# Patient Record
Sex: Male | Born: 1995 | Race: Black or African American | Hispanic: No | Marital: Single | Smoking: Current every day smoker
Health system: Southern US, Community
[De-identification: ages and names within clinical notes are randomized; demographics above are authoritative.]

---
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
## 2016-09-09 ENCOUNTER — Emergency Department (HOSPITAL_BASED_OUTPATIENT_CLINIC_OR_DEPARTMENT_OTHER)
Admission: EM | Admit: 2016-09-09 | Discharge: 2016-09-09 | Disposition: A | Discharge status: Home / Self Care | Payer: Medicaid Other | Attending: Emergency Medicine | Admitting: Emergency Medicine

## 2016-09-09 ENCOUNTER — Encounter (HOSPITAL_BASED_OUTPATIENT_CLINIC_OR_DEPARTMENT_OTHER): Payer: Self-pay | Admitting: Emergency Medicine

## 2016-09-09 DIAGNOSIS — R102 Pelvic and perineal pain: Secondary | ICD-10-CM | POA: Insufficient documentation

## 2016-09-09 DIAGNOSIS — Z5321 Procedure and treatment not carried out due to patient leaving prior to being seen by health care provider: Secondary | ICD-10-CM | POA: Insufficient documentation

## 2016-09-09 LAB — URINALYSIS, ROUTINE W REFLEX MICROSCOPIC
Bilirubin Urine: NEGATIVE
Glucose, UA: NEGATIVE mg/dL
Hgb urine dipstick: NEGATIVE
KETONES UR: NEGATIVE mg/dL
NITRITE: NEGATIVE
PROTEIN: NEGATIVE mg/dL
Specific Gravity, Urine: 1.023 (ref 1.005–1.030)
pH: 7.5 (ref 5.0–8.0)

## 2016-09-09 LAB — URINALYSIS, MICROSCOPIC (REFLEX)

## 2016-09-09 NOTE — ED Triage Notes (Signed)
Patient reports that he is having some lower pelvic pain intermitently and a "strange feeling" when he urinats thinks he has an STD

## 2016-09-09 NOTE — ED Notes (Signed)
ptient states that he does not want to stay any longer. The patient reports that he will come back later and see if it will be a shorter wait. Discussed the risks of leaving

## 2016-09-10 ENCOUNTER — Encounter (HOSPITAL_BASED_OUTPATIENT_CLINIC_OR_DEPARTMENT_OTHER): Payer: Self-pay | Admitting: Emergency Medicine

## 2016-09-10 ENCOUNTER — Emergency Department (HOSPITAL_BASED_OUTPATIENT_CLINIC_OR_DEPARTMENT_OTHER)
Admission: EM | Admit: 2016-09-10 | Discharge: 2016-09-10 | Disposition: A | Discharge status: Home / Self Care | Payer: Self-pay | Attending: Emergency Medicine | Admitting: Emergency Medicine

## 2016-09-10 DIAGNOSIS — Z202 Contact with and (suspected) exposure to infections with a predominantly sexual mode of transmission: Secondary | ICD-10-CM | POA: Insufficient documentation

## 2016-09-10 DIAGNOSIS — F17200 Nicotine dependence, unspecified, uncomplicated: Secondary | ICD-10-CM | POA: Insufficient documentation

## 2016-09-10 DIAGNOSIS — R369 Urethral discharge, unspecified: Secondary | ICD-10-CM

## 2016-09-10 DIAGNOSIS — N342 Other urethritis: Secondary | ICD-10-CM

## 2016-09-10 DIAGNOSIS — N341 Nonspecific urethritis: Secondary | ICD-10-CM | POA: Insufficient documentation

## 2016-09-10 MED ORDER — CEFTRIAXONE SODIUM 250 MG IJ SOLR
250.0000 mg | Freq: Once | INTRAMUSCULAR | Status: AC
  Administered 2016-09-10: 250 mg via INTRAMUSCULAR
  Filled 2016-09-10: qty 250

## 2016-09-10 MED ORDER — AZITHROMYCIN 250 MG PO TABS
1000.0000 mg | ORAL_TABLET | Freq: Once | ORAL | Status: AC
  Administered 2016-09-10: 1000 mg via ORAL
  Filled 2016-09-10: qty 4

## 2016-09-10 NOTE — Discharge Instructions (Signed)
You have been treated for a STD today - the results will be available on MyChart in 2-3 days. Do not have sex for two weeks. Have all partners tested and treated Use condoms to prevent STDs Return for worsening symptoms

## 2016-09-10 NOTE — ED Provider Notes (Signed)
[facility] Provider [facility]   CSN: [id number] Arrival date & time: 09/10/16  1929     History   Chief Complaint Chief Complaint  Patient presents with  . Exposure to STD    HPI Lawrence Ortega is a 21 y.o. male who presents with dysuria. He states that he's had this feeling for about a week when he urinates. He has a burning discomfort when he urinates. He denies penile discharge. He had unprotected sex with a new partner on vacation last week. No fever or genital sores. He has never had an STD before.  HPI  History reviewed. No pertinent past medical history.  There are no active problems to display for this patient.   History reviewed. No pertinent surgical history.     Home Medications    Prior to Admission medications   Medication Sig Start Date End Date Taking? Authorizing Provider  ibuprofen (ADVIL,MOTRIN) 200 MG tablet Take 400 mg by mouth once.      [provider]  Lidocaine, Anorectal, 5 % CREA Apply to rectum before or after bowel movements as needed for pain. 12/27/11   Molpus, Jonny RuizJohn, MD    Family History History reviewed. No pertinent family history.  Social History Social History  Substance Use Topics  . Smoking status: Current Every Day Smoker  . Smokeless tobacco: Never Used  . Alcohol use No     Allergies   Patient has no known allergies.   Review of Systems Review of Systems  Constitutional: Negative for fever.  Genitourinary: Positive for dysuria. Negative for difficulty urinating, discharge, genital sores, penile pain and testicular pain.     Physical Exam Updated Vital Signs BP 119/69 (BP Location: Right Arm)   Pulse 72   Temp 98.5 F (36.9 C) (Oral)   Resp 18   Ht 5\' 6"  (1.676 m)   Wt 88.5 kg (195 lb)   SpO2 100%   BMI 31.47 kg/m   Physical Exam  Constitutional: He is oriented to person, place, and time. He appears well-developed and well-nourished. No distress.  HENT:  Head: Normocephalic and  atraumatic.  Eyes: Pupils are equal, round, and reactive to light. Conjunctivae are normal. Right eye exhibits no discharge. Left eye exhibits no discharge. No scleral icterus.  Neck: Normal range of motion.  Cardiovascular: Normal rate.   Pulmonary/Chest: Effort normal. No respiratory distress.  Abdominal: He exhibits no distension.  Genitourinary:  Genitourinary Comments: No inguinal lymphadenopathy or inguinal hernia noted. Normal circumcised penis free of lesions or rash. There is yellow discharge present in the urethra. Testicles are nontender with normal lie. Normal scrotal appearance. Chaperone present during exam.    Neurological: He is alert and oriented to person, place, and time.  Skin: Skin is warm and dry.  Psychiatric: He has a normal mood and affect. His behavior is normal.  Nursing note and vitals reviewed.    ED Treatments / Results  Labs (all labs ordered are listed, but only abnormal results are displayed) Labs Reviewed  RPR  HIV ANTIBODY (ROUTINE TESTING)  GC/CHLAMYDIA PROBE AMP ([place]) NOT AT [facility]    EKG  EKG Interpretation None       Radiology No results found.  Procedures Procedures (including critical care time)  Medications Ordered in ED Medications  cefTRIAXone (ROCEPHIN) injection 250 mg (250 mg Intramuscular Given 09/10/16 2059)  azithromycin (ZITHROMAX) tablet 1,000 mg (1,000 mg Oral Given 09/10/16 2059)     Initial Impression / Assessment and Plan / ED Course  I have reviewed the triage vital signs and the nursing notes.  Pertinent labs & imaging results that were available during my care of the patient were reviewed by me and considered in my medical decision making (see chart for details).  21 year old with urethritis likely due to STD. Vitals are normal. Exam remarkable for obvious discharge. Will treat with Rocephin and Azithromycin. Advised safe sex practices. Return precautions given.  Final Clinical Impressions(s) / ED  Diagnoses   Final diagnoses:  Penile discharge  Urethritis    New Prescriptions New Prescriptions   No medications on file     Beryle Quant 09/10/16 2132    Alvira Monday, MD 09/15/16 2218

## 2016-09-10 NOTE — ED Triage Notes (Signed)
Patient states that he may have been exposed to a STD

## 2016-09-11 LAB — GC/CHLAMYDIA PROBE AMP (~~LOC~~) NOT AT ARMC
CHLAMYDIA, DNA PROBE: POSITIVE — AB
NEISSERIA GONORRHEA: POSITIVE — AB

## 2016-09-12 LAB — RPR: RPR: NONREACTIVE

## 2016-09-12 LAB — HIV ANTIBODY (ROUTINE TESTING W REFLEX): HIV Screen 4th Generation wRfx: NONREACTIVE

## 2017-02-01 ENCOUNTER — Emergency Department (HOSPITAL_BASED_OUTPATIENT_CLINIC_OR_DEPARTMENT_OTHER)
Admission: EM | Admit: 2017-02-01 | Discharge: 2017-02-01 | Disposition: A | Discharge status: Home / Self Care | Payer: Self-pay | Attending: Emergency Medicine | Admitting: Emergency Medicine

## 2017-02-01 ENCOUNTER — Other Ambulatory Visit: Payer: Self-pay

## 2017-02-01 ENCOUNTER — Encounter (HOSPITAL_BASED_OUTPATIENT_CLINIC_OR_DEPARTMENT_OTHER): Payer: Self-pay | Admitting: Emergency Medicine

## 2017-02-01 DIAGNOSIS — F17210 Nicotine dependence, cigarettes, uncomplicated: Secondary | ICD-10-CM | POA: Insufficient documentation

## 2017-02-01 DIAGNOSIS — Z202 Contact with and (suspected) exposure to infections with a predominantly sexual mode of transmission: Secondary | ICD-10-CM | POA: Insufficient documentation

## 2017-02-01 DIAGNOSIS — Z79899 Other long term (current) drug therapy: Secondary | ICD-10-CM | POA: Insufficient documentation

## 2017-02-01 DIAGNOSIS — R369 Urethral discharge, unspecified: Secondary | ICD-10-CM | POA: Insufficient documentation

## 2017-02-01 LAB — URINALYSIS, MICROSCOPIC (REFLEX)

## 2017-02-01 LAB — URINALYSIS, ROUTINE W REFLEX MICROSCOPIC
Bilirubin Urine: NEGATIVE
GLUCOSE, UA: NEGATIVE mg/dL
Ketones, ur: NEGATIVE mg/dL
Nitrite: NEGATIVE
PROTEIN: NEGATIVE mg/dL
Specific Gravity, Urine: 1.02 (ref 1.005–1.030)
pH: 7 (ref 5.0–8.0)

## 2017-02-01 MED ORDER — CEFTRIAXONE SODIUM 250 MG IJ SOLR
250.0000 mg | Freq: Once | INTRAMUSCULAR | Status: AC
  Administered 2017-02-01: 250 mg via INTRAMUSCULAR
  Filled 2017-02-01: qty 250

## 2017-02-01 MED ORDER — AZITHROMYCIN 250 MG PO TABS
1000.0000 mg | ORAL_TABLET | Freq: Once | ORAL | Status: AC
  Administered 2017-02-01: 1000 mg via ORAL
  Filled 2017-02-01: qty 4

## 2017-02-01 MED ORDER — METRONIDAZOLE 500 MG PO TABS
2000.0000 mg | ORAL_TABLET | Freq: Once | ORAL | Status: AC
  Administered 2017-02-01: 2000 mg via ORAL
  Filled 2017-02-01: qty 4

## 2017-02-01 NOTE — ED Triage Notes (Signed)
Patient states that he wants to be checked for STD because he feels like the girl he slept with may have had one. The patient denies any S/S of an STD

## 2017-02-02 NOTE — ED Provider Notes (Signed)
[facility] EMERGENCY DEPARTMENT Provider Note   CSN: [id number] Arrival date & time: 02/01/17  1756     History   Chief Complaint Chief Complaint  Patient presents with  . Exposure to STD    HPI Candis SchatzCameron Edgecombe is a [age] y.o. male.  HPI   Presents with concern for possible STD exposure.  Reports believes he had a sexual partner that had an STD, thinks it was chlamydia. 3 days ago had discharge from urethra. Denies significant dysuria. No flank pain, no abdominal pain.  Denies rash, scrotal pain. No other concerns today.   History reviewed. No pertinent past medical history.  There are no active problems to display for this patient.   History reviewed. No pertinent surgical history.     Home Medications    Prior to Admission medications   Medication Sig Start Date End Date Taking? Authorizing Provider  ibuprofen (ADVIL,MOTRIN) 200 MG tablet Take 400 mg by mouth once.      [provider]  Lidocaine, Anorectal, 5 % CREA Apply to rectum before or after bowel movements as needed for pain. 12/27/11   Molpus, Jonny RuizJohn, MD    Family History History reviewed. No pertinent family history.  Social History Social History   Tobacco Use  . Smoking status: Current Every Day Smoker  . Smokeless tobacco: Never Used  Substance Use Topics  . Alcohol use: No  . Drug use: Yes    Types: Marijuana     Allergies   Patient has no known allergies.   Review of Systems Review of Systems  Constitutional: Negative for fever.  Gastrointestinal: Negative for abdominal pain, nausea and vomiting.  Genitourinary: Positive for discharge. Negative for dysuria, flank pain, genital sores, hematuria, scrotal swelling and testicular pain.  Musculoskeletal: Negative for back pain.  Skin: Negative for rash.     Physical Exam Updated Vital Signs BP 121/86 (BP Location: Right Arm)   Pulse 60   Temp 97.8 F (36.6 C) (Oral)   Resp 18   Ht 6' (1.829 m)   Wt 82.6 kg (182  lb)   SpO2 99%   BMI 24.68 kg/m   Physical Exam  Constitutional: He is oriented to person, place, and time. He appears well-developed and well-nourished. No distress.  HENT:  Head: Normocephalic and atraumatic.  Eyes: Conjunctivae and EOM are normal.  Neck: Normal range of motion.  Cardiovascular: Normal rate, regular rhythm and intact distal pulses.  Pulmonary/Chest: Effort normal. No respiratory distress.  Abdominal: Soft. He exhibits no distension. There is no tenderness. There is no guarding.  Musculoskeletal: He exhibits no edema.  Neurological: He is alert and oriented to person, place, and time.  Skin: Skin is warm and dry. He is not diaphoretic.  Nursing note and vitals reviewed.    ED Treatments / Results  Labs (all labs ordered are listed, but only abnormal results are displayed) Labs Reviewed  URINALYSIS, ROUTINE W REFLEX MICROSCOPIC - Abnormal; Notable for the following components:      Result Value   APPearance CLOUDY (*)    Hgb urine dipstick TRACE (*)    Leukocytes, UA MODERATE (*)    All other components within normal limits  URINALYSIS, MICROSCOPIC (REFLEX) - Abnormal; Notable for the following components:   Bacteria, UA RARE (*)    Squamous Epithelial / LPF 0-5 (*)    All other components within normal limits  GC/CHLAMYDIA PROBE AMP ([place]) NOT AT [facility]    EKG  EKG Interpretation None  Radiology No results found.  Procedures Procedures (including critical care time)  Medications Ordered in ED Medications  cefTRIAXone (ROCEPHIN) injection 250 mg (250 mg Intramuscular Given 02/01/17 2054)  azithromycin (ZITHROMAX) tablet 1,000 mg (1,000 mg Oral Given 02/01/17 2054)  metroNIDAZOLE (FLAGYL) tablet 2,000 mg (2,000 mg Oral Given 02/01/17 2054)     Initial Impression / Assessment and Plan / ED Course  I have reviewed the triage vital signs and the nursing notes.  Pertinent labs & imaging results that were available during my care of  the patient were reviewed by me and considered in my medical decision making (see chart for details).     21yo male presents with concern for penile discharge, exposure to STD.  Suspect findings on UA secondary to urethritis from STD, doubt other UTI. No other symptoms. Ordered GC/Chl, treated empirically with rocephin, azithromycin and flagyl. Discussed safe sex practices. Patient discharged in stable condition with understanding of reasons to return.   Final Clinical Impressions(s) / ED Diagnoses   Final diagnoses:  STD exposure    ED Discharge Orders    None       Alvira MondaySchlossman, Adalyna Godbee, MD 02/02/17 [phone]

## 2017-02-04 LAB — GC/CHLAMYDIA PROBE AMP (~~LOC~~) NOT AT ARMC
CHLAMYDIA, DNA PROBE: NEGATIVE
Neisseria Gonorrhea: POSITIVE — AB

## 2017-07-01 ENCOUNTER — Other Ambulatory Visit: Payer: Self-pay

## 2017-07-01 ENCOUNTER — Emergency Department (HOSPITAL_COMMUNITY): Payer: Self-pay

## 2017-07-01 ENCOUNTER — Encounter (HOSPITAL_COMMUNITY): Payer: Self-pay | Admitting: *Deleted

## 2017-07-01 ENCOUNTER — Emergency Department (HOSPITAL_COMMUNITY)
Admission: EM | Admit: 2017-07-01 | Discharge: 2017-07-01 | Disposition: A | Discharge status: Home / Self Care | Payer: Self-pay | Attending: Emergency Medicine | Admitting: Emergency Medicine

## 2017-07-01 DIAGNOSIS — S0085XA Superficial foreign body of other part of head, initial encounter: Secondary | ICD-10-CM | POA: Insufficient documentation

## 2017-07-01 DIAGNOSIS — Y22XXXA Handgun discharge, undetermined intent, initial encounter: Secondary | ICD-10-CM | POA: Insufficient documentation

## 2017-07-01 DIAGNOSIS — S31139A Puncture wound of abdominal wall without foreign body, unspecified quadrant without penetration into peritoneal cavity, initial encounter: Secondary | ICD-10-CM

## 2017-07-01 DIAGNOSIS — Y939 Activity, unspecified: Secondary | ICD-10-CM | POA: Insufficient documentation

## 2017-07-01 DIAGNOSIS — Y999 Unspecified external cause status: Secondary | ICD-10-CM | POA: Insufficient documentation

## 2017-07-01 DIAGNOSIS — N289 Disorder of kidney and ureter, unspecified: Secondary | ICD-10-CM

## 2017-07-01 DIAGNOSIS — Y92810 Car as the place of occurrence of the external cause: Secondary | ICD-10-CM | POA: Insufficient documentation

## 2017-07-01 DIAGNOSIS — S0501XA Injury of conjunctiva and corneal abrasion without foreign body, right eye, initial encounter: Secondary | ICD-10-CM | POA: Insufficient documentation

## 2017-07-01 DIAGNOSIS — S31104A Unspecified open wound of abdominal wall, left lower quadrant without penetration into peritoneal cavity, initial encounter: Secondary | ICD-10-CM | POA: Insufficient documentation

## 2017-07-01 DIAGNOSIS — I4891 Unspecified atrial fibrillation: Secondary | ICD-10-CM | POA: Insufficient documentation

## 2017-07-01 DIAGNOSIS — W3400XA Accidental discharge from unspecified firearms or gun, initial encounter: Secondary | ICD-10-CM

## 2017-07-01 DIAGNOSIS — H02811 Retained foreign body in right upper eyelid: Secondary | ICD-10-CM | POA: Insufficient documentation

## 2017-07-01 DIAGNOSIS — Z1881 Retained glass fragments: Secondary | ICD-10-CM | POA: Insufficient documentation

## 2017-07-01 LAB — BPAM FFP
BLOOD PRODUCT EXPIRATION DATE: 201906022359
BLOOD PRODUCT EXPIRATION DATE: 201906062359
ISSUE DATE / TIME: 201905270235
ISSUE DATE / TIME: 201905270235
UNIT TYPE AND RH: 6200
Unit Type and Rh: 6200

## 2017-07-01 LAB — BPAM RBC
BLOOD PRODUCT EXPIRATION DATE: 201906172359
BLOOD PRODUCT EXPIRATION DATE: 201906172359
ISSUE DATE / TIME: 201905270234
ISSUE DATE / TIME: 201905270234
UNIT TYPE AND RH: 9500
Unit Type and Rh: 9500

## 2017-07-01 LAB — PREPARE FRESH FROZEN PLASMA
UNIT DIVISION: 0
Unit division: 0

## 2017-07-01 LAB — TYPE AND SCREEN
ABO/RH(D): O POS
Antibody Screen: NEGATIVE
UNIT DIVISION: 0
UNIT DIVISION: 0

## 2017-07-01 LAB — PROTIME-INR
INR: 1.07
PROTHROMBIN TIME: 13.8 s (ref 11.4–15.2)

## 2017-07-01 LAB — CBC
HCT: 44.5 % (ref 39.0–52.0)
Hemoglobin: 13.7 g/dL (ref 13.0–17.0)
MCH: 24.1 pg — ABNORMAL LOW (ref 26.0–34.0)
MCHC: 30.8 g/dL (ref 30.0–36.0)
MCV: 78.3 fL (ref 78.0–100.0)
PLATELETS: 289 10*3/uL (ref 150–400)
RBC: 5.68 MIL/uL (ref 4.22–5.81)
RDW: 19 % — ABNORMAL HIGH (ref 11.5–15.5)
WBC: 7 10*3/uL (ref 4.0–10.5)

## 2017-07-01 LAB — COMPREHENSIVE METABOLIC PANEL
ALT: 15 U/L — ABNORMAL LOW (ref 17–63)
AST: 23 U/L (ref 15–41)
Albumin: 4 g/dL (ref 3.5–5.0)
Alkaline Phosphatase: 72 U/L (ref 38–126)
Anion gap: 10 (ref 5–15)
BUN: 20 mg/dL (ref 6–20)
CALCIUM: 9.1 mg/dL (ref 8.9–10.3)
CO2: 21 mmol/L — ABNORMAL LOW (ref 22–32)
CREATININE: 1.55 mg/dL — AB (ref 0.61–1.24)
Chloride: 107 mmol/L (ref 101–111)
Glucose, Bld: 103 mg/dL — ABNORMAL HIGH (ref 65–99)
Potassium: 3.8 mmol/L (ref 3.5–5.1)
SODIUM: 138 mmol/L (ref 135–145)
Total Bilirubin: 1.1 mg/dL (ref 0.3–1.2)
Total Protein: 7.2 g/dL (ref 6.5–8.1)

## 2017-07-01 LAB — I-STAT CHEM 8, ED
BUN: 22 mg/dL — ABNORMAL HIGH (ref 6–20)
Calcium, Ion: 1.07 mmol/L — ABNORMAL LOW (ref 1.15–1.40)
Chloride: 106 mmol/L (ref 101–111)
Creatinine, Ser: 1.5 mg/dL — ABNORMAL HIGH (ref 0.61–1.24)
GLUCOSE: 103 mg/dL — AB (ref 65–99)
HEMATOCRIT: 44 % (ref 39.0–52.0)
HEMOGLOBIN: 15 g/dL (ref 13.0–17.0)
POTASSIUM: 3.8 mmol/L (ref 3.5–5.1)
Sodium: 139 mmol/L (ref 135–145)
TCO2: 23 mmol/L (ref 22–32)

## 2017-07-01 LAB — ETHANOL

## 2017-07-01 LAB — ABO/RH: ABO/RH(D): O POS

## 2017-07-01 LAB — CDS SEROLOGY

## 2017-07-01 LAB — I-STAT CG4 LACTIC ACID, ED: Lactic Acid, Venous: 1.72 mmol/L (ref 0.5–1.9)

## 2017-07-01 MED ORDER — APIXABAN 5 MG PO TABS: 5.0000 mg | ORAL_TABLET | Freq: Two times a day (BID) | ORAL | 0 refills | Status: AC

## 2017-07-01 MED ORDER — FLUORESCEIN SODIUM 1 MG OP STRP
1.0000 | ORAL_STRIP | Freq: Once | OPHTHALMIC | Status: AC
  Administered 2017-07-01: 1 via OPHTHALMIC
  Filled 2017-07-01: qty 1

## 2017-07-01 MED ORDER — ERYTHROMYCIN 5 MG/GM OP OINT
1.0000 "application " | TOPICAL_OINTMENT | Freq: Once | OPHTHALMIC | Status: AC
  Administered 2017-07-01: 1 via OPHTHALMIC
  Filled 2017-07-01: qty 3.5

## 2017-07-01 MED ORDER — APIXABAN 5 MG PO TABS
5.0000 mg | ORAL_TABLET | Freq: Once | ORAL | Status: AC
Start: 2017-07-01 — End: 2017-07-01
  Administered 2017-07-01: 5 mg via ORAL
  Filled 2017-07-01: qty 1

## 2017-07-01 MED ORDER — SODIUM CHLORIDE 0.9 % IV SOLN: INTRAVENOUS | Status: DC

## 2017-07-01 MED ORDER — SODIUM CHLORIDE 0.9 % IV BOLUS
1000.0000 mL | Freq: Once | INTRAVENOUS | Status: AC
  Administered 2017-07-01: 1000 mL via INTRAVENOUS

## 2017-07-01 MED ORDER — FENTANYL CITRATE (PF) 100 MCG/2ML IJ SOLN
INTRAMUSCULAR | Status: AC | PRN
  Administered 2017-07-01: 25 ug via INTRAVENOUS

## 2017-07-01 MED ORDER — IOHEXOL 300 MG/ML  SOLN
100.0000 mL | Freq: Once | INTRAMUSCULAR | Status: AC | PRN
  Administered 2017-07-01: 100 mL via INTRAVENOUS

## 2017-07-01 MED ORDER — ERYTHROMYCIN 5 MG/GM OP OINT: TOPICAL_OINTMENT | Freq: Four times a day (QID) | OPHTHALMIC | 0 refills | Status: AC

## 2017-07-01 MED ORDER — TETRACAINE HCL 0.5 % OP SOLN
2.0000 [drp] | Freq: Once | OPHTHALMIC | Status: AC
  Administered 2017-07-01: 2 [drp] via OPHTHALMIC
  Filled 2017-07-01: qty 4

## 2017-07-01 MED ORDER — TRAMADOL HCL 50 MG PO TABS: 50.0000 mg | ORAL_TABLET | Freq: Four times a day (QID) | ORAL | 0 refills | Status: AC | PRN

## 2017-07-01 NOTE — ED Notes (Signed)
Dr Preston Fleeting at the bedside examining his eyes
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
## 2017-07-01 NOTE — Discharge Instructions (Addendum)
Take acetaminophen or ibuprofen as needed for less severe pain.  Follow up with the eye doctor if your eye is not improved int two days.

## 2017-07-01 NOTE — ED Notes (Signed)
Med requested from pharmacy.
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
## 2017-07-01 NOTE — ED Notes (Signed)
The pt has an irregular rhy that appears to be af

## 2017-07-03 ENCOUNTER — Encounter (HOSPITAL_BASED_OUTPATIENT_CLINIC_OR_DEPARTMENT_OTHER): Payer: Self-pay | Admitting: Emergency Medicine

## 2017-07-08 ENCOUNTER — Telehealth (HOSPITAL_COMMUNITY): Payer: Self-pay | Admitting: *Deleted

## 2017-07-08 NOTE — Telephone Encounter (Signed)
Pt contacted to make follow up appt.  Pt has not picked up blood thinner and was strongly encouraged to do so.  Pt understood stroke risk for afib when not on blood thinner.   Pt was offered 30 day free card and stated that he might come pick it up

## 2017-07-15 ENCOUNTER — Ambulatory Visit (HOSPITAL_COMMUNITY): Payer: Self-pay | Admitting: Nurse Practitioner

## 2017-07-17 ENCOUNTER — Emergency Department (HOSPITAL_BASED_OUTPATIENT_CLINIC_OR_DEPARTMENT_OTHER)
Admission: EM | Admit: 2017-07-17 | Discharge: 2017-07-17 | Disposition: A | Discharge status: Home / Self Care | Payer: Self-pay | Attending: Emergency Medicine | Admitting: Emergency Medicine

## 2017-07-17 ENCOUNTER — Other Ambulatory Visit: Payer: Self-pay

## 2017-07-17 ENCOUNTER — Encounter (HOSPITAL_BASED_OUTPATIENT_CLINIC_OR_DEPARTMENT_OTHER): Payer: Self-pay

## 2017-07-17 DIAGNOSIS — Z79899 Other long term (current) drug therapy: Secondary | ICD-10-CM | POA: Insufficient documentation

## 2017-07-17 DIAGNOSIS — F17210 Nicotine dependence, cigarettes, uncomplicated: Secondary | ICD-10-CM | POA: Insufficient documentation

## 2017-07-17 DIAGNOSIS — Z202 Contact with and (suspected) exposure to infections with a predominantly sexual mode of transmission: Secondary | ICD-10-CM | POA: Insufficient documentation

## 2017-07-17 DIAGNOSIS — Z7901 Long term (current) use of anticoagulants: Secondary | ICD-10-CM | POA: Insufficient documentation

## 2017-07-17 DIAGNOSIS — R369 Urethral discharge, unspecified: Secondary | ICD-10-CM | POA: Insufficient documentation

## 2017-07-17 LAB — URINALYSIS, ROUTINE W REFLEX MICROSCOPIC
BILIRUBIN URINE: NEGATIVE
Glucose, UA: NEGATIVE mg/dL
KETONES UR: NEGATIVE mg/dL
NITRITE: NEGATIVE
Protein, ur: NEGATIVE mg/dL
Specific Gravity, Urine: 1.01 (ref 1.005–1.030)
pH: 7 (ref 5.0–8.0)

## 2017-07-17 LAB — URINALYSIS, MICROSCOPIC (REFLEX)

## 2017-07-17 MED ORDER — METRONIDAZOLE 500 MG PO TABS
2000.0000 mg | ORAL_TABLET | Freq: Once | ORAL | Status: AC
  Administered 2017-07-17: 2000 mg via ORAL
  Filled 2017-07-17: qty 4

## 2017-07-17 MED ORDER — CEFTRIAXONE SODIUM 250 MG IJ SOLR
250.0000 mg | Freq: Once | INTRAMUSCULAR | Status: AC
  Administered 2017-07-17: 250 mg via INTRAMUSCULAR
  Filled 2017-07-17: qty 250

## 2017-07-17 MED ORDER — AZITHROMYCIN 250 MG PO TABS
1000.0000 mg | ORAL_TABLET | Freq: Once | ORAL | Status: AC
  Administered 2017-07-17: 1000 mg via ORAL
  Filled 2017-07-17: qty 4

## 2017-07-17 NOTE — ED Triage Notes (Signed)
C/o penile d/c x 2-3 days-NAD-steady gait

## 2017-07-17 NOTE — Discharge Instructions (Signed)
Thank you for allowing me to provide your care today in the Emergency Department.   Your tests for gonorrhea, chlamydia, trichomonas, HIV, and syphilis are pending.  If positive, someone from the hospital will call you at the number that you gave to registration today.   You have already been treated in the emergency department for gonorrhea, chlamydia, and trichomonas.  Please abstain from all sexual activities for the next 7 days.  If 1 of the tests come back as positive, it is important that you let all sexual partners know so they can get tested or seek treatment.  You can take 650 mg of Tylenol every 6 hours for pain control.  If your symptoms do not improve within the next week, please follow-up with your primary care provider or if you do not have one, you can call and schedule follow-up appointment with [facility].  Return to the emergency department if you develop fever chills, severe abdominal or back pain, continue to have discharge despite treatment, or develop other new, concerning symptoms.

## 2017-07-17 NOTE — ED Notes (Signed)
ED Provider at bedside. 
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
## 2017-07-18 LAB — RPR: RPR: NONREACTIVE

## 2017-07-18 LAB — URINE CULTURE: CULTURE: NO GROWTH

## 2017-07-18 LAB — GC/CHLAMYDIA PROBE AMP (~~LOC~~) NOT AT ARMC
CHLAMYDIA, DNA PROBE: POSITIVE — AB
Neisseria Gonorrhea: POSITIVE — AB

## 2017-07-18 LAB — HIV ANTIBODY (ROUTINE TESTING W REFLEX): HIV Screen 4th Generation wRfx: NONREACTIVE
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
